# Patient Record
Sex: Female | Born: 1960 | Race: White | Hispanic: No | Smoking: Current every day smoker
Health system: Southern US, Community
[De-identification: ages and names within clinical notes are randomized; demographics above are authoritative.]

## PROBLEM LIST (undated history)

## (undated) DIAGNOSIS — C801 Malignant (primary) neoplasm, unspecified: Secondary | ICD-10-CM

## (undated) DIAGNOSIS — C539 Malignant neoplasm of cervix uteri, unspecified: Secondary | ICD-10-CM

## (undated) DIAGNOSIS — I219 Acute myocardial infarction, unspecified: Secondary | ICD-10-CM

## (undated) HISTORY — PX: TONSILLECTOMY: SUR1361

## (undated) HISTORY — PX: ABDOMINAL HYSTERECTOMY: SHX81

---

## 2014-10-22 ENCOUNTER — Encounter (HOSPITAL_COMMUNITY): Payer: Self-pay | Admitting: Emergency Medicine

## 2014-10-22 ENCOUNTER — Emergency Department (HOSPITAL_COMMUNITY)
Admission: EM | Admit: 2014-10-22 | Discharge: 2014-10-22 | Disposition: A | Discharge status: Home / Self Care | Payer: BLUE CROSS/BLUE SHIELD | Attending: Emergency Medicine | Admitting: Emergency Medicine

## 2014-10-22 ENCOUNTER — Emergency Department (HOSPITAL_COMMUNITY): Payer: BLUE CROSS/BLUE SHIELD

## 2014-10-22 DIAGNOSIS — M79672 Pain in left foot: Secondary | ICD-10-CM

## 2014-10-22 DIAGNOSIS — Z8541 Personal history of malignant neoplasm of cervix uteri: Secondary | ICD-10-CM | POA: Diagnosis not present

## 2014-10-22 DIAGNOSIS — Z8679 Personal history of other diseases of the circulatory system: Secondary | ICD-10-CM | POA: Insufficient documentation

## 2014-10-22 DIAGNOSIS — Z720 Tobacco use: Secondary | ICD-10-CM | POA: Insufficient documentation

## 2014-10-22 DIAGNOSIS — R2242 Localized swelling, mass and lump, left lower limb: Secondary | ICD-10-CM | POA: Diagnosis present

## 2014-10-22 HISTORY — DX: Acute myocardial infarction, unspecified: I21.9

## 2014-10-22 HISTORY — DX: Malignant neoplasm of cervix uteri, unspecified: C53.9

## 2014-10-22 HISTORY — DX: Malignant (primary) neoplasm, unspecified: C80.1

## 2014-10-22 MED ORDER — IBUPROFEN 800 MG PO TABS: 800.0000 mg | ORAL_TABLET | Freq: Three times a day (TID) | ORAL | Status: AC

## 2014-10-22 NOTE — ED Notes (Signed)
Pt from home c/o left foot swelling. She reports wearing shoes that pushed her toes together. She has been using advil that has helped some. Last dose of advil was 1700. Denies injury.

## 2014-10-22 NOTE — Discharge Instructions (Signed)
Cryotherapy °Cryotherapy means treatment with cold. Ice or gel packs can be used to reduce both pain and swelling. Ice is the most helpful within the first 24 to 48 hours after an injury or flare-up from overusing a muscle or joint. Sprains, strains, spasms, burning pain, shooting pain, and aches can all be eased with ice. Ice can also be used when recovering from surgery. Ice is effective, has very few side effects, and is safe for most people to use. °PRECAUTIONS  °Ice is not a safe treatment option for people with: °· Raynaud phenomenon. This is a condition affecting small blood vessels in the extremities. Exposure to cold may cause your problems to return. °· Cold hypersensitivity. There are many forms of cold hypersensitivity, including: °¨ Cold urticaria. Red, itchy hives appear on the skin when the tissues begin to warm after being iced. °¨ Cold erythema. This is a red, itchy rash caused by exposure to cold. °¨ Cold hemoglobinuria. Red blood cells break down when the tissues begin to warm after being iced. The hemoglobin that carry oxygen are passed into the urine because they cannot combine with blood proteins fast enough. °· Numbness or altered sensitivity in the area being iced. °If you have any of the following conditions, do not use ice until you have discussed cryotherapy with your caregiver: °· Heart conditions, such as arrhythmia, angina, or chronic heart disease. °· High blood pressure. °· Healing wounds or open skin in the area being iced. °· Current infections. °· Rheumatoid arthritis. °· Poor circulation. °· Diabetes. °Ice slows the blood flow in the region it is applied. This is beneficial when trying to stop inflamed tissues from spreading irritating chemicals to surrounding tissues. However, if you expose your skin to cold temperatures for too long or without the proper protection, you can damage your skin or nerves. Watch for signs of skin damage due to cold. °HOME CARE INSTRUCTIONS °Follow  these tips to use ice and cold packs safely. °· Place a dry or damp towel between the ice and skin. A damp towel will cool the skin more quickly, so you may need to shorten the time that the ice is used. °· For a more rapid response, add gentle compression to the ice. °· Ice for no more than 10 to 20 minutes at a time. The bonier the area you are icing, the less time it will take to get the benefits of ice. °· Check your skin after 5 minutes to make sure there are no signs of a poor response to cold or skin damage. °· Rest 20 minutes or more between uses. °· Once your skin is numb, you can end your treatment. You can test numbness by very lightly touching your skin. The touch should be so light that you do not see the skin dimple from the pressure of your fingertip. When using ice, most people will feel these normal sensations in this order: cold, burning, aching, and numbness. °· Do not use ice on someone who cannot communicate their responses to pain, such as small children or people with dementia. °HOW TO MAKE AN ICE PACK °Ice packs are the most common way to use ice therapy. Other methods include ice massage, ice baths, and cryosprays. Muscle creams that cause a cold, tingly feeling do not offer the same benefits that ice offers and should not be used as a substitute unless recommended by your caregiver. °To make an ice pack, do one of the following: °· Place crushed ice or a   bag of frozen vegetables in a sealable plastic bag. Squeeze out the excess air. Place this bag inside another plastic bag. Slide the bag into a pillowcase or place a damp towel between your skin and the bag. °· Mix 3 parts water with 1 part rubbing alcohol. Freeze the mixture in a sealable plastic bag. When you remove the mixture from the freezer, it will be slushy. Squeeze out the excess air. Place this bag inside another plastic bag. Slide the bag into a pillowcase or place a damp towel between your skin and the bag. °SEEK MEDICAL CARE  IF: °· You develop white spots on your skin. This may give the skin a blotchy (mottled) appearance. °· Your skin turns blue or pale. °· Your skin becomes waxy or hard. °· Your swelling gets worse. °MAKE SURE YOU:  °· Understand these instructions. °· Will watch your condition. °· Will get help right away if you are not doing well or get worse. °Document Released: 10/01/2010 Document Revised: 06/21/2013 Document Reviewed: 10/01/2010 °[employer]® Patient Information ©2015 [employer]. This information is not intended to replace advice given to you by your health care provider. Make sure you discuss any questions you have with your health care provider. ° °

## 2014-10-22 NOTE — ED Provider Notes (Signed)
CSN: [id number]     Arrival date & time 10/22/14  1908 History  This chart was scribed for Charlann Lange, PA-C, working with Evelina Bucy, MD by Julien Nordmann, ED Scribe. This patient was seen in room WTR8/WTR8 and the patient's care was started at 8:46 PM.    Chief Complaint  Patient presents with  . Foot Swelling      The history is provided by the patient. No language interpreter was used.   HPI Comments: Caroline Nunez is a 54 y.o. female who presents to the Emergency Department complaining of constant, gradual worsening left foot swelling onset 2 days ago. Pt reports wearing some tennis shoes that pushed her toes together. Pt reports breaking her left pinky toe and states it has given her trouble but her foot has not swollen like this before. Pt reports taking OTC Advil to alleviate the pain with minimal relief. Pt has also elevated her foot to help alleviate the swelling with relief. She states that she stands at work and she has worked long hour shifts this past week. Pt denies any previous injury.  Past Medical History  Diagnosis Date  . Cancer   . Cervical cancer   . Heart attack    Past Surgical History  Procedure Laterality Date  . Abdominal hysterectomy    . Tonsillectomy     No family history on file. Social History  Substance Use Topics  . Smoking status: Current Every Day Smoker -- 1.00 packs/day    Types: Cigarettes  . Smokeless tobacco: None  . Alcohol Use: No   OB History    No data available     Review of Systems  Constitutional: Negative for fever.  Musculoskeletal: Positive for joint swelling.  Skin: Negative for color change and wound.  Neurological: Negative for numbness.      Allergies  Codeine  Home Medications   Prior to Admission medications   Not on File   Triage vitals: BP 160/66 mmHg  Pulse 90  Temp(Src) 98.8 F (37.1 C) (Oral)  Resp 20  SpO2 99% Physical Exam  Constitutional: She appears well-developed and well-nourished. No  distress.  HENT:  Head: Normocephalic and atraumatic.  Eyes: Right eye exhibits no discharge. Left eye exhibits no discharge.  Pulmonary/Chest: Effort normal. No respiratory distress.  Musculoskeletal:  Left foot is moderately swollen extending to ankle where ankle has no swelling, no wound, no inner digital rash or weeping, no warmth or redness, the only tenderness is manipulation of the fifth digit, no calf tenderness  Neurological: She is alert.  Skin: She is not diaphoretic.  Psychiatric: She has a normal mood and affect. Her behavior is normal.  Nursing note and vitals reviewed.   ED Course  Procedures  DIAGNOSTIC STUDIES: Oxygen Saturation is 99% on RA, normal by my interpretation.  COORDINATION OF CARE:  8:50 PM Discussed treatment plan which includes work note for Monday, keep foot elevated with pt at bedside and pt agreed to plan.  Labs Review Labs Reviewed - No data to display  Imaging Review No results found. I have personally reviewed and evaluated these images and lab results as part of my medical decision-making.   EKG Interpretation None      MDM   Final diagnoses:  None    1. Left foot pain  No fracture injury. No evidence cellulitis, wound or infection. Swelling improves with elevation. Will recommend 800 mg ibuprofen q 8 hours and elevation with rest.   I personally performed the services  described in this documentation, which was scribed in my presence. The recorded information has been reviewed and is accurate. ,   Charlann Lange, PA-C 10/23/14 9675  Evelina Bucy, MD 10/23/14 [phone]

## 2016-06-21 IMAGING — CR DG FOOT COMPLETE 3+V*L*
3 series · 3 of 3 positions shown · non-contrast
Comparison: None.

CLINICAL DATA: Acute onset of left lateral and plantar foot pain,
and soft tissue swelling. Initial encounter.

EXAM:
LEFT FOOT - COMPLETE 3+ VIEW

[x foot ap left]
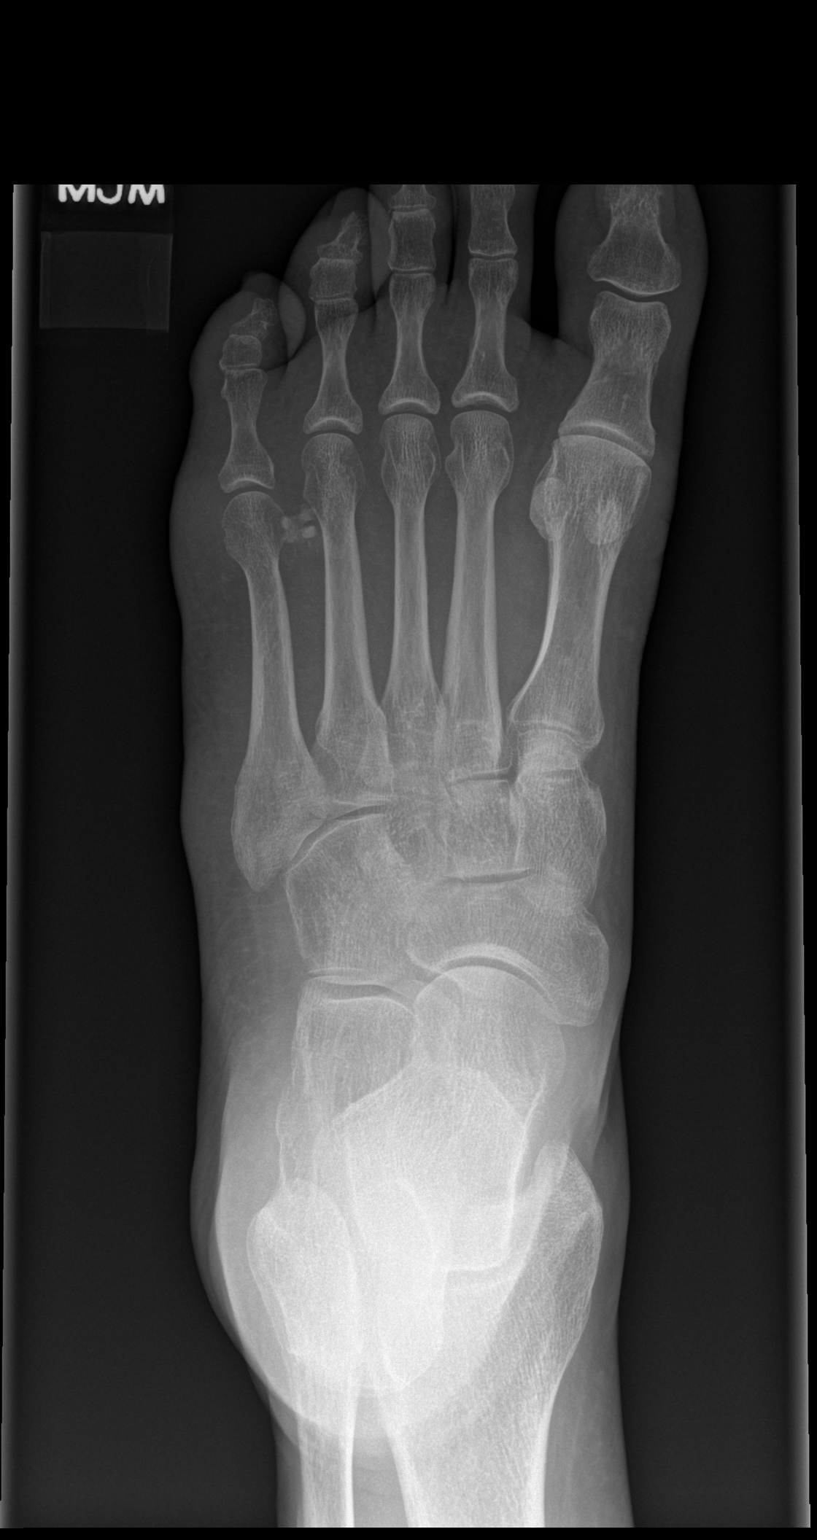

[x foot obl left]
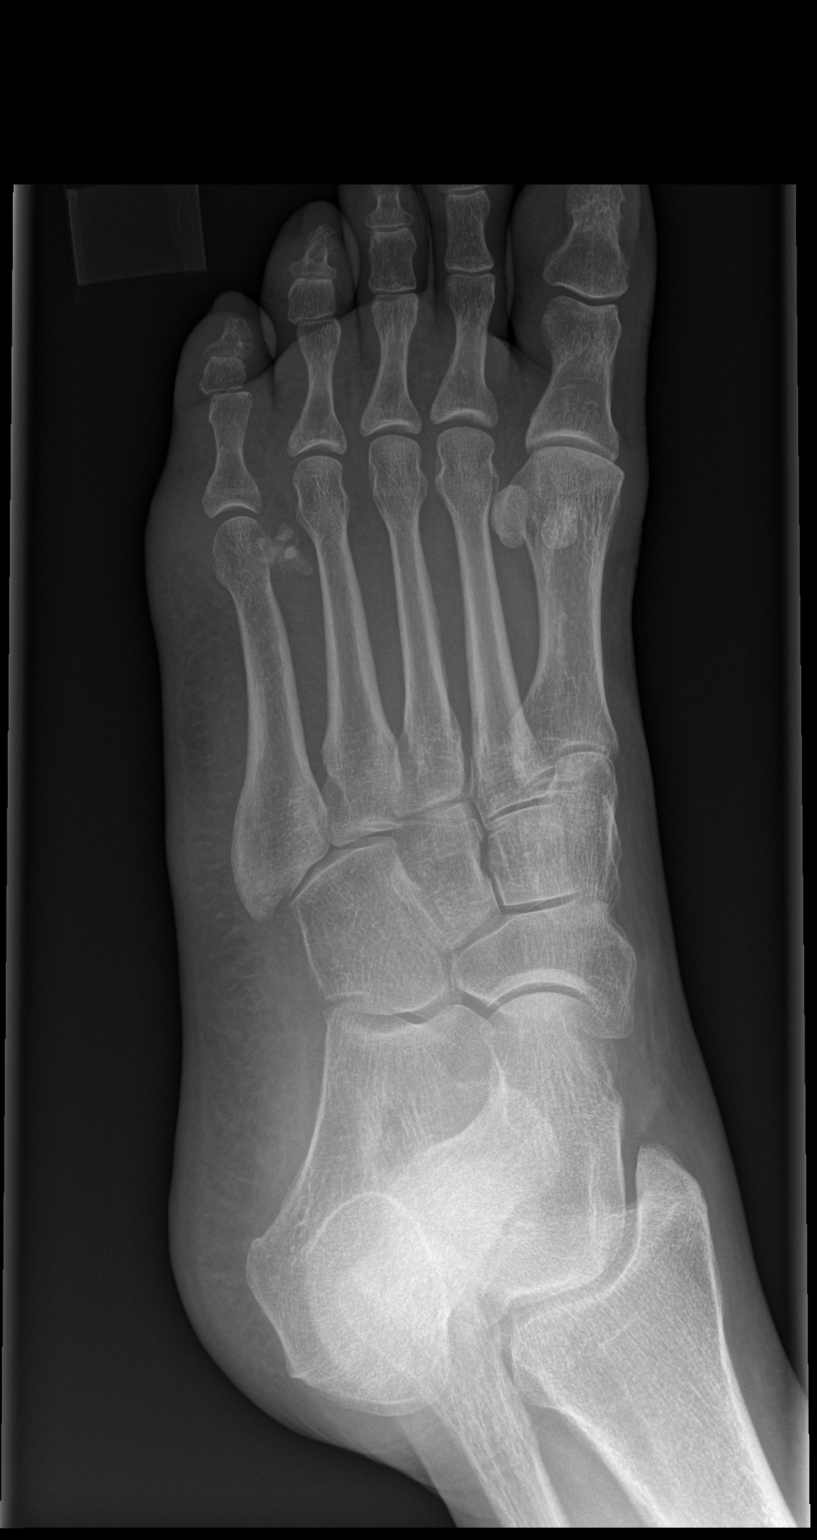

[x foot lat left]
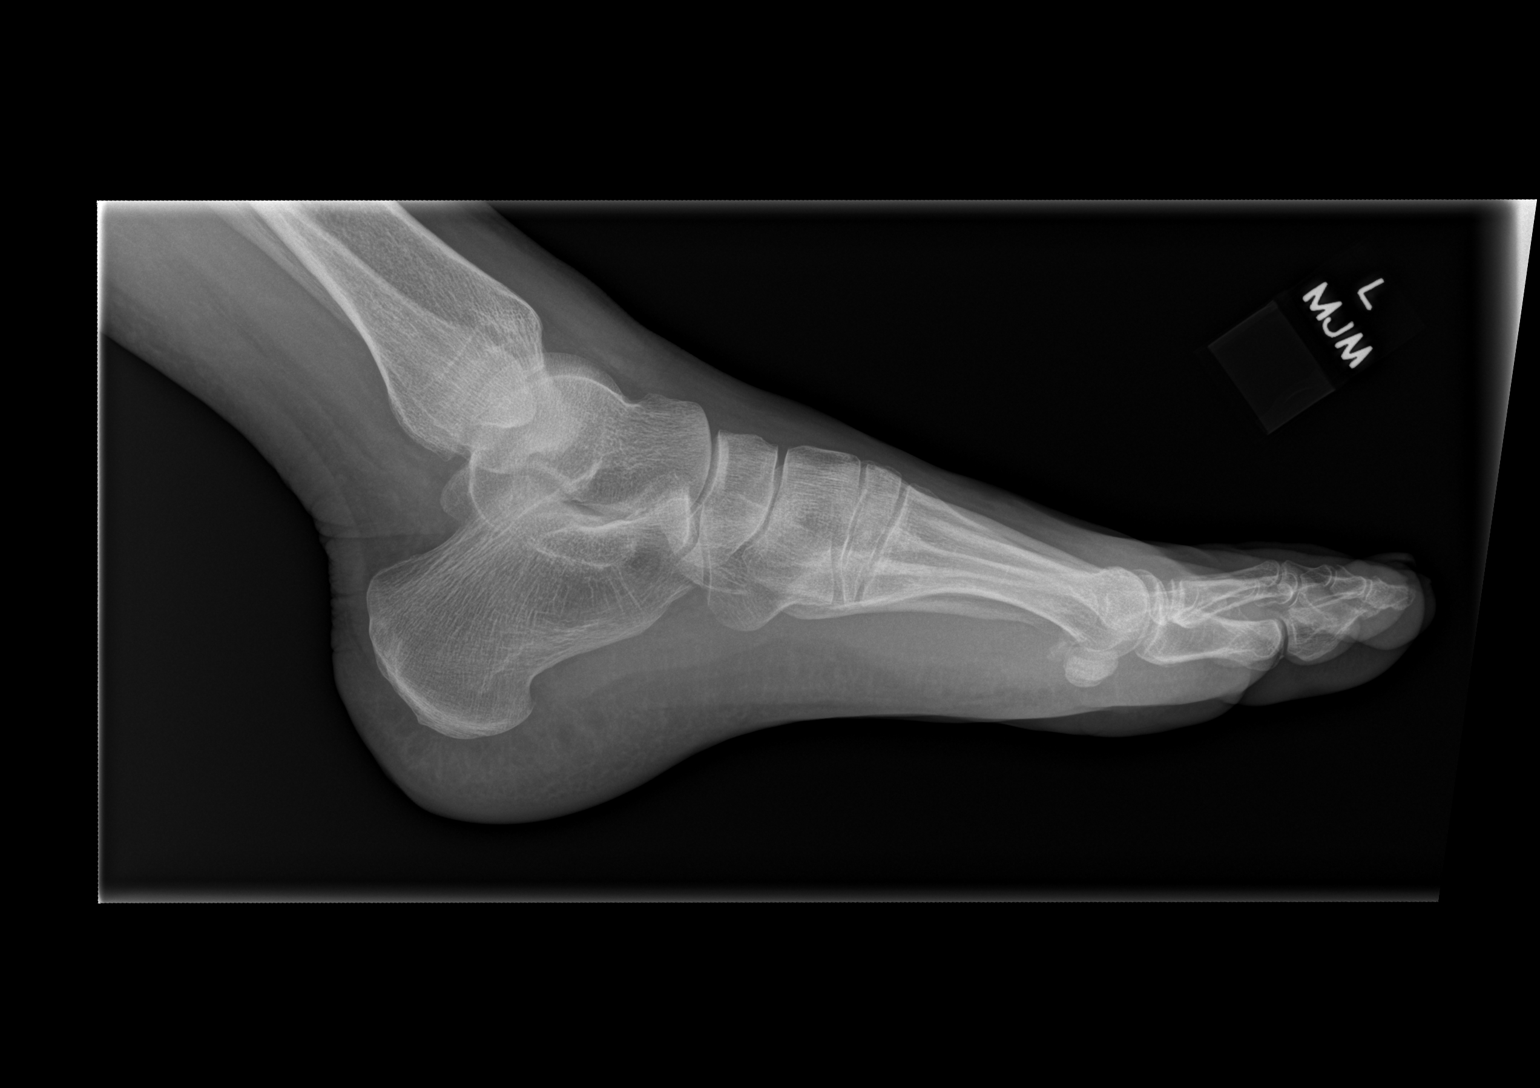

[3 of 3 positions shown; findings below may reference images not displayed]

FINDINGS: There is no evidence of fracture or dislocation. A few vague
high-density foci are seen projecting between the distal fourth and
fifth metatarsals, just plantar to the metatarsals. These may simply
reflect soft tissue calcifications, though would correlate for any
evidence of a soft tissue wound to suggest foreign bodies.

The joint spaces are preserved. There is no evidence of talar
subluxation; the subtalar joint is unremarkable in appearance.

No significant soft tissue abnormalities are seen.
IMPRESSION: 1. No evidence of fracture or dislocation.
2. Few vague high-density foci seen projecting between the distal
fourth and fifth metatarsals, just plantar to the metatarsals. These
may simply reflect soft tissue calcifications, though would
correlate for any evidence of a soft tissue wound to suggest foreign
bodies.

## 2020-07-07 ENCOUNTER — Ambulatory Visit: Payer: BLUE CROSS/BLUE SHIELD | Admitting: Family Medicine

## 2022-11-23 ENCOUNTER — Emergency Department (HOSPITAL_COMMUNITY)
Admission: EM | Admit: 2022-11-23 | Discharge: 2022-11-23 | Disposition: A | Discharge status: Home / Self Care | Payer: 59 | Attending: Emergency Medicine | Admitting: Emergency Medicine

## 2022-11-23 ENCOUNTER — Other Ambulatory Visit: Payer: Self-pay

## 2022-11-23 DIAGNOSIS — M62838 Other muscle spasm: Secondary | ICD-10-CM | POA: Insufficient documentation

## 2022-11-23 DIAGNOSIS — M542 Cervicalgia: Secondary | ICD-10-CM | POA: Diagnosis present

## 2022-11-23 MED ORDER — ACETAMINOPHEN 500 MG PO TABS
1000.0000 mg | ORAL_TABLET | Freq: Once | ORAL | Status: DC
  Filled 2022-11-23: qty 2

## 2022-11-23 MED ORDER — LIDOCAINE 5 % EX PTCH
1.0000 | MEDICATED_PATCH | Freq: Once | CUTANEOUS | Status: DC
  Administered 2022-11-23: 1 via TRANSDERMAL
  Filled 2022-11-23: qty 1

## 2022-11-23 NOTE — ED Triage Notes (Signed)
Patient reports posterior neck pain onset yesterday morning ,denies injury , took Advil prior to arrival with mild relief .

## 2022-11-23 NOTE — Discharge Instructions (Signed)
You were seen in the emergency department for your neck pain.  This did appear most consistent with a muscle spasm.  You can continue to take Tylenol or Advil as needed for pain.  Both can be taken up to every 6 hours.  You can use ice or heat or lidocaine patches.  You should try to stretch your neck and range her muscles to help prevent them from getting more stiff.  You can follow-up with your primary doctor in the next few days to have your symptoms rechecked.  You should return to the emergency department if you develop numbness or weakness in your arms or legs or any other new or concerning symptoms.

## 2022-11-23 NOTE — ED Provider Notes (Signed)
[place] EMERGENCY DEPARTMENT AT [facility] Provider Note   CSN: [id number] Arrival date & time: 11/23/22  8295     History  Chief Complaint  Patient presents with   Neck Pain     Caroline Nunez is a 62 y.o. female.  Patient is a 62 year old female with no significant past medical history presenting to the emergency department with neck pain.  The patient states that she woke up yesterday morning with a stiff neck.  She states that she initially took Motrin with improvement of her pain.  She states that she put on a heat patch last night before bed and then woke up around 3 AM to go to the bathroom and her pain worsened.  She states that it does feel like a muscle spasm type of pain.  She denies any numbness or weakness.  She states that she is again took Motrin this morning with some relief.  The history is provided by the patient and a relative.       Home Medications Prior to Admission medications   Medication Sig Start Date End Date Taking? Authorizing Provider  ibuprofen (ADVIL,MOTRIN) 800 MG tablet Take 1 tablet (800 mg total) by mouth 3 (three) times daily. 10/22/14   Elpidio Anis, PA-C      Allergies    Codeine    Review of Systems   Review of Systems  Physical Exam Updated Vital Signs BP (!) 145/84 (BP Location: Right Arm)   Pulse 85   Temp 97.7 F (36.5 C) (Oral)   Resp (!) 22   SpO2 100%  Physical Exam Vitals and nursing note reviewed.  Constitutional:      Appearance: Normal appearance.  HENT:     Head: Normocephalic and atraumatic.     Nose: Nose normal.     Mouth/Throat:     Mouth: Mucous membranes are moist.  Eyes:     Extraocular Movements: Extraocular movements intact.  Neck:     Comments: No midline neck tenderness Left-sided occipital tenderness and left trapezius muscle tenderness to palpation Neck rotation bilaterally intact, limited neck extension secondary to pain, neck flexion intact Cardiovascular:     Rate and Rhythm:  Normal rate.     Pulses: Normal pulses.  Pulmonary:     Effort: Pulmonary effort is normal.  Musculoskeletal:        General: Normal range of motion.  Skin:    General: Skin is warm and dry.  Neurological:     General: No focal deficit present.     Mental Status: She is alert and oriented to person, place, and time.     Sensory: No sensory deficit.     Motor: No weakness (5 out of 5 strength in bilateral grip strength, elbow flexion/extension and shoulder abduction).  Psychiatric:        Mood and Affect: Mood normal.        Behavior: Behavior normal.     ED Results / Procedures / Treatments   Labs (all labs ordered are listed, but only abnormal results are displayed) Labs Reviewed - No data to display  EKG None  Radiology No results found.  Procedures Procedures    Medications Ordered in ED Medications  acetaminophen (TYLENOL) tablet 1,000 mg (has no administration in time range)  lidocaine (LIDODERM) 5 % 1-3 patch (has no administration in time range)    ED Course/ Medical Decision Making/ A&P  Medical Decision Making This patient presents to the ED with chief complaint(s) of neck pain with no pertinent past medical history which further complicates the presenting complaint. The complaint involves an extensive differential diagnosis and also carries with it a high risk of complications and morbidity.    The differential diagnosis includes muscle strain or spasm, no midline neck tenderness and no trauma or falls making fracture dislocation unlikely, no neurologic deficits making a spinal cord injury or neurovascular injury unlikely, no fever or headache making meningitis unlikely  Additional history obtained: Additional history obtained from family Records reviewed Care Everywhere/External Records -outpatient orthopedic records  ED Course and Reassessment: On patient's arrival she is hemodynamically stable in no acute distress.  She  has no midline neck tenderness and no trauma, she is neurovascularly intact.  Exam is most consistent with muscle strain or spasm.  Patient declined any muscle relaxers or sedating medication and she will be given Tylenol and lidocaine patch.  She was recommended stretches and close primary care follow-up and was given strict return precautions.  Independent labs interpretation:  N/A  Independent visualization of imaging: - N/A  Consultation: - Consulted or discussed management/test interpretation w/ external professional: N/A  Consideration for admission or further workup: Patient has no emergent conditions requiring admission or further work-up at this time and is stable for discharge home with primary care follow-up  Social Determinants of health: N/A    Risk OTC drugs. Prescription drug management.          Final Clinical Impression(s) / ED Diagnoses Final diagnoses:  Cervical paraspinal muscle spasm    Rx / DC Orders ED Discharge Orders     None         Rexford Maus, DO 11/23/22 [phone]

## 2023-12-03 ENCOUNTER — Ambulatory Visit (INDEPENDENT_AMBULATORY_CARE_PROVIDER_SITE_OTHER)

## 2023-12-03 ENCOUNTER — Ambulatory Visit (HOSPITAL_COMMUNITY)
Admission: EM | Admit: 2023-12-03 | Discharge: 2023-12-03 | Disposition: A | Discharge status: Home / Self Care | Attending: Emergency Medicine | Admitting: Emergency Medicine

## 2023-12-03 ENCOUNTER — Encounter (HOSPITAL_COMMUNITY): Payer: Self-pay | Admitting: Emergency Medicine

## 2023-12-03 DIAGNOSIS — R0789 Other chest pain: Secondary | ICD-10-CM

## 2023-12-03 DIAGNOSIS — R0602 Shortness of breath: Secondary | ICD-10-CM

## 2023-12-03 DIAGNOSIS — F17209 Nicotine dependence, unspecified, with unspecified nicotine-induced disorders: Secondary | ICD-10-CM | POA: Diagnosis not present

## 2023-12-03 MED ORDER — ALBUTEROL SULFATE (2.5 MG/3ML) 0.083% IN NEBU
2.5000 mg | INHALATION_SOLUTION | Freq: Once | RESPIRATORY_TRACT | Status: AC
  Administered 2023-12-03: 2.5 mg via RESPIRATORY_TRACT

## 2023-12-03 MED ORDER — ALBUTEROL SULFATE HFA 108 (90 BASE) MCG/ACT IN AERS: 1.0000 | INHALATION_SPRAY | Freq: Four times a day (QID) | RESPIRATORY_TRACT | 0 refills | Status: AC | PRN

## 2023-12-03 MED ORDER — ALBUTEROL SULFATE (2.5 MG/3ML) 0.083% IN NEBU
INHALATION_SOLUTION | RESPIRATORY_TRACT | Status: AC
  Filled 2023-12-03: qty 3

## 2023-12-03 MED ORDER — IPRATROPIUM BROMIDE 0.02 % IN SOLN
RESPIRATORY_TRACT | Status: AC
  Filled 2023-12-03: qty 2.5

## 2023-12-03 MED ORDER — DEXAMETHASONE SOD PHOSPHATE PF 10 MG/ML IJ SOLN
10.0000 mg | Freq: Once | INTRAMUSCULAR | Status: AC
  Administered 2023-12-03: 10 mg via INTRAMUSCULAR

## 2023-12-03 MED ORDER — PREDNISONE 20 MG PO TABS: 40.0000 mg | ORAL_TABLET | Freq: Every day | ORAL | 0 refills | Status: AC

## 2023-12-03 MED ORDER — IPRATROPIUM BROMIDE 0.02 % IN SOLN
0.5000 mg | Freq: Once | RESPIRATORY_TRACT | Status: AC
  Administered 2023-12-03: 0.5 mg via RESPIRATORY_TRACT

## 2023-12-03 MED ORDER — AMOXICILLIN-POT CLAVULANATE 875-125 MG PO TABS: 1.0000 | ORAL_TABLET | Freq: Two times a day (BID) | ORAL | 0 refills | Status: AC

## 2023-12-03 NOTE — ED Triage Notes (Signed)
 Pt reports having chest tightness and hard to breath this morning. Had cold with lots of clear slime over the weekend. Taking Tylenol  cold and flu.

## 2023-12-03 NOTE — Discharge Instructions (Addendum)
 Chest x-ray did not reveal any pneumonia.  I believe you are having a COPD exacerbation from your chronic smoking.  We are treating this with antibiotics, take these twice daily with food for the next 7 days.  You are also being treated with steroids, take these daily with breakfast for the next 4 days.  They will help open your lungs up.  Please try to cut back or stop smoking.  It is important that you follow-up with a primary care provider for routine evaluation and ongoing monitoring.  They will be able to assist with an official diagnosis of COPD and medication management.  Seek immediate care if you develop any new concerning symptoms or no improvement over the next 2 to 3 days despite interventions.

## 2023-12-03 NOTE — ED Provider Notes (Addendum)
 [facility]    CSN: [id number] Arrival date & time: 12/03/23  0935      History   Chief Complaint Chief Complaint  Patient presents with   Shortness of Breath    HPI Caroline Nunez is a 63 y.o. female.   Patient brought into clinic by his son over concern of shortness of breath that started this morning.  She had a productive cough for the past 5 days or so and has been taking Tylenol  Cold and flu.  Thought she was feeling better this morning to go into work when she had to call her son, was breathless.  Gets short of breath with simple exertion such as walking to the car or putting on her clothes.  Son thinks maybe she had a fever at the beginning.  Has not complained of hot or cold chills for the past few days.  Did take 2 COVID test at home and they were negative.  Productive cough with clear sputum.  Is a chronic cigarette smoker, smokes about a pack per day for maybe the past 30 or 40 years, is unsure.  Is not having chest pain.  Reports a heaviness on her chest.  Shortness of breath with talking or any exertion.  Denies wheezing.  Does not seek routine medical care.  Has had difficulty finding a primary care provider with appointments or that accepts her insurance.  The history is provided by the patient and medical records.  Shortness of Breath   Past Medical History:  Diagnosis Date   Cancer (HCC)    Cervical cancer (HCC)    Heart attack (HCC)     There are no active problems to display for this patient.   Past Surgical History:  Procedure Laterality Date   ABDOMINAL HYSTERECTOMY     TONSILLECTOMY      OB History   No obstetric history on file.      Home Medications    Prior to Admission medications   Medication Sig Start Date End Date Taking? Authorizing Provider  albuterol (VENTOLIN HFA) 108 (90 Base) MCG/ACT inhaler Inhale 1-2 puffs into the lungs every 6 (six) hours as needed for wheezing or shortness of breath. 12/03/23  Yes Avelino Herren,  Mardel Grudzien  N, [name]  amoxicillin-clavulanate (AUGMENTIN) 875-125 MG tablet Take 1 tablet by mouth every 12 (twelve) hours. 12/03/23  Yes Yomira Flitton  N, [name]  predniSONE (DELTASONE) 20 MG tablet Take 2 tablets (40 mg total) by mouth daily with breakfast for 4 days. 12/03/23 12/07/23 Yes Jillana Selph  N, [name]  ibuprofen  (ADVIL ,MOTRIN ) 800 MG tablet Take 1 tablet (800 mg total) by mouth 3 (three) times daily. 10/22/14   Odell Balls, PA-C    Family History No family history on file.  Social History Social History   Tobacco Use   Smoking status: Every Day    Current packs/day: 1.00    Types: Cigarettes  Substance Use Topics   Alcohol use: No     Allergies   Codeine   Review of Systems Review of Systems  Per HPI  Physical Exam Triage Vital Signs ED Triage Vitals  Encounter Vitals Group     BP 12/03/23 0954 (!) 172/63     Girls Systolic BP Percentile --      Girls Diastolic BP Percentile --      Boys Systolic BP Percentile --      Boys Diastolic BP Percentile --      Pulse Rate 12/03/23 0954 77     Resp 12/03/23 0954 ROLLEN)  22     Temp 12/03/23 0954 97.7 F (36.5 C)     Temp Source 12/03/23 0954 Oral     SpO2 12/03/23 0954 93 %     Weight --      Height --      Head Circumference --      Peak Flow --      Pain Score 12/03/23 0955 0     Pain Loc --      Pain Education --      Exclude from Growth Chart --    No data found.  Updated Vital Signs BP (!) 172/63 (BP Location: Right Arm)   Pulse 77   Temp 97.7 F (36.5 C) (Oral)   Resp (!) 22   SpO2 93%   Visual Acuity Right Eye Distance:   Left Eye Distance:   Bilateral Distance:    Right Eye Near:   Left Eye Near:    Bilateral Near:     Physical Exam Vitals and nursing note reviewed.  Constitutional:      Appearance: Normal appearance. She is well-developed.  HENT:     Head: Normocephalic and atraumatic.     Right Ear: External ear normal.     Left Ear: External ear normal.     Nose: Nose normal.      Mouth/Throat:     Mouth: Mucous membranes are moist.  Eyes:     Conjunctiva/sclera: Conjunctivae normal.  Cardiovascular:     Rate and Rhythm: Normal rate and regular rhythm.  Pulmonary:     Breath sounds: Examination of the right-lower field reveals decreased breath sounds. Examination of the left-lower field reveals decreased breath sounds. Decreased breath sounds present. No wheezing.  Skin:    General: Skin is warm and dry.  Neurological:     General: No focal deficit present.     Mental Status: She is alert and oriented to person, place, and time.  Psychiatric:        Mood and Affect: Mood normal.        Behavior: Behavior normal. Behavior is cooperative.      UC Treatments / Results  Labs (all labs ordered are listed, but only abnormal results are displayed) Labs Reviewed - No data to display  EKG   Radiology DG Chest 2 View Result Date: 12/03/2023 CLINICAL DATA:  Chest tightness and shortness of breath. EXAM: CHEST - 2 VIEW COMPARISON:  None Available. FINDINGS: The heart size and mediastinal contours are within normal limits. No acute infiltrate, pleural effusion or pneumothorax is identified. The visualized skeletal structures are unremarkable. IMPRESSION: No active cardiopulmonary disease. Electronically Signed   By: Suzen Dials M.D.   On: 12/03/2023 11:17    Procedures Procedures (including critical care time)  Medications Ordered in UC Medications  dexamethasone (DECADRON) injection 10 mg (has no administration in time range)  albuterol (PROVENTIL) (2.5 MG/3ML) 0.083% nebulizer solution 2.5 mg (2.5 mg Nebulization Given 12/03/23 1111)  ipratropium (ATROVENT) nebulizer solution 0.5 mg (0.5 mg Nebulization Given 12/03/23 1111)    Initial Impression / Assessment and Plan / UC Course  I have reviewed the triage vital signs and the nursing notes.  Pertinent labs & imaging results that were available during my care of the patient were reviewed by me and  considered in my medical decision making (see chart for details).  Vitals and triage reviewed, patient is tachypneic with hypertension.  Increased work of breathing.  Visibly short of breath.  Oxygen ranges from 93 to 94%  on room air.  Winded when speaking.  Lungs with diminished sounds in lower lobes.  Without wheezing or rhonchi.  Heart with regular rate and rhythm.  Chest x-ray by my interpretation does not show pneumonia, radiology overread shows no active cardiopulmonary disease.  EKG by my interpretation shows normal sinus rhythm, without ST elevation or ST depression.  Rate of 78 bpm.  Albuterol/ipratropium nebulizer treatment given in clinic, continues to have chest tightness.  Will give IM steroid prior to departure.  Appears stable for discharge.  Strict emergency precautions given if no improvement over the next 48 hours or if new symptoms develop.  Discussed importance of establishing with a PCP for any official chronic diagnosis.  Also for cancer screenings.  Patient verbalized understanding, no questions at this time.  Work note provided.    Final Clinical Impressions(s) / UC Diagnoses   Final diagnoses:  Shortness of breath  Chest heaviness  Tobacco use disorder, continuous     Discharge Instructions      Chest x-ray did not reveal any pneumonia.  I believe you are having a COPD exacerbation from your chronic smoking.  We are treating this with antibiotics, take these twice daily with food for the next 7 days.  You are also being treated with steroids, take these daily with breakfast for the next 4 days.  They will help open your lungs up.  Please try to cut back or stop smoking.  It is important that you follow-up with a primary care provider for routine evaluation and ongoing monitoring.  They will be able to assist with an official diagnosis of COPD and medication management.  Seek immediate care if you develop any new concerning symptoms or no improvement over the  next 2 to 3 days despite interventions.      ED Prescriptions     Medication Sig Dispense Auth. Provider   amoxicillin-clavulanate (AUGMENTIN) 875-125 MG tablet Take 1 tablet by mouth every 12 (twelve) hours. 14 tablet [name], Rielynn Trulson  N, [name]   predniSONE (DELTASONE) 20 MG tablet Take 2 tablets (40 mg total) by mouth daily with breakfast for 4 days. 8 tablet [name], Semira Stoltzfus  N, [name]   albuterol (VENTOLIN HFA) 108 (90 Base) MCG/ACT inhaler Inhale 1-2 puffs into the lungs every 6 (six) hours as needed for wheezing or shortness of breath. 18 g [name], Arora Coakley  N, [name]      PDMP not reviewed this encounter.   [name], Esparanza Krider  N, [name] 12/03/23 1135    [name], Necie Wilcoxson  N, [name] 12/03/23 1152

## 2023-12-04 ENCOUNTER — Ambulatory Visit: Payer: Self-pay

## 2023-12-04 NOTE — Telephone Encounter (Signed)
 Pt states that she is calling to schedule appt for f/u. Pt was seen in UC yesterday, unsure what she was dx with, but states she was given albuterol, amoxicillin and prednisone. Pt states that she feels the same as yesterday, NT confirmed that pt is not feeling worse then d/c yesterday at [facility]. Pt scheduled for new pt appt at [facility] Copied from CRM [id number]. Topic: 11/5. Advised to call back if s/s worsen of if she has further questions, was advised to use UC or ED. Pt also advised of mobile bus, and advised location changes daily, to call and get location if needed.   Clinical - Red Word Triage >> Dec 04, 2023  4:07 PM Mercer PEDLAR wrote: Red Word that prompted transfer to Nurse Triage: Difficulty breathing

## 2023-12-05 ENCOUNTER — Emergency Department (HOSPITAL_BASED_OUTPATIENT_CLINIC_OR_DEPARTMENT_OTHER): Admitting: Radiology

## 2023-12-05 ENCOUNTER — Emergency Department (HOSPITAL_BASED_OUTPATIENT_CLINIC_OR_DEPARTMENT_OTHER)
Admission: EM | Admit: 2023-12-05 | Discharge: 2023-12-05 | Disposition: A | Discharge status: Home / Self Care | Source: Ambulatory Visit | Attending: Emergency Medicine | Admitting: Emergency Medicine

## 2023-12-05 ENCOUNTER — Emergency Department (HOSPITAL_BASED_OUTPATIENT_CLINIC_OR_DEPARTMENT_OTHER)

## 2023-12-05 ENCOUNTER — Encounter (HOSPITAL_BASED_OUTPATIENT_CLINIC_OR_DEPARTMENT_OTHER): Payer: Self-pay

## 2023-12-05 DIAGNOSIS — F17210 Nicotine dependence, cigarettes, uncomplicated: Secondary | ICD-10-CM | POA: Diagnosis not present

## 2023-12-05 DIAGNOSIS — J441 Chronic obstructive pulmonary disease with (acute) exacerbation: Secondary | ICD-10-CM | POA: Diagnosis not present

## 2023-12-05 DIAGNOSIS — R0600 Dyspnea, unspecified: Secondary | ICD-10-CM | POA: Diagnosis present

## 2023-12-05 LAB — BASIC METABOLIC PANEL WITH GFR
Anion gap: 14 (ref 5–15)
BUN: 10 mg/dL (ref 8–23)
CO2: 28 mmol/L (ref 22–32)
Calcium: 10 mg/dL (ref 8.9–10.3)
Chloride: 100 mmol/L (ref 98–111)
Creatinine, Ser: 0.6 mg/dL (ref 0.44–1.00)
GFR, Estimated: >60 mL/min (ref >60)
Glucose, Bld: 197 mg/dL — ABNORMAL HIGH (ref 70–99)
Potassium: 4 mmol/L (ref 3.5–5.1)
Sodium: 141 mmol/L (ref 135–145)

## 2023-12-05 LAB — CBC
HCT: 47 % — ABNORMAL HIGH (ref 36.0–46.0)
Hemoglobin: 15.8 g/dL — ABNORMAL HIGH (ref 12.0–15.0)
MCH: 31.4 pg (ref 26.0–34.0)
MCHC: 33.6 g/dL (ref 30.0–36.0)
MCV: 93.4 fL (ref 80.0–100.0)
Platelets: 289 K/uL (ref 150–400)
RBC: 5.03 MIL/uL (ref 3.87–5.11)
RDW: 13.2 % (ref 11.5–15.5)
WBC: 9.8 K/uL (ref 4.0–10.5)
nRBC: 0 % (ref 0.0–0.2)

## 2023-12-05 LAB — RESP PANEL BY RT-PCR (RSV, FLU A&B, COVID)  RVPGX2
Influenza A by PCR: NEGATIVE
Influenza B by PCR: NEGATIVE
Resp Syncytial Virus by PCR: NEGATIVE
SARS Coronavirus 2 by RT PCR: NEGATIVE

## 2023-12-05 MED ORDER — METHYLPREDNISOLONE SODIUM SUCC 125 MG IJ SOLR
125.0000 mg | Freq: Once | INTRAMUSCULAR | Status: AC
  Administered 2023-12-05: 125 mg via INTRAVENOUS
  Filled 2023-12-05: qty 2

## 2023-12-05 MED ORDER — ALBUTEROL (5 MG/ML) CONTINUOUS INHALATION SOLN: 7.5000 mg/h | INHALATION_SOLUTION | Freq: Once | RESPIRATORY_TRACT | Status: DC

## 2023-12-05 MED ORDER — IPRATROPIUM-ALBUTEROL 0.5-2.5 (3) MG/3ML IN SOLN
3.0000 mL | Freq: Once | RESPIRATORY_TRACT | Status: AC
  Administered 2023-12-05: 3 mL via RESPIRATORY_TRACT

## 2023-12-05 MED ORDER — SPACER/AERO-HOLDING CHAMBERS DEVI: 0 refills | Status: AC

## 2023-12-05 MED ORDER — ALBUTEROL SULFATE (2.5 MG/3ML) 0.083% IN NEBU
7.5000 mg | INHALATION_SOLUTION | Freq: Once | RESPIRATORY_TRACT | Status: AC
  Administered 2023-12-05: 7.5 mg via RESPIRATORY_TRACT
  Filled 2023-12-05: qty 9

## 2023-12-05 MED ORDER — AZITHROMYCIN 250 MG PO TABS: 250.0000 mg | ORAL_TABLET | Freq: Every day | ORAL | 0 refills | Status: AC

## 2023-12-05 MED ORDER — ALBUTEROL SULFATE HFA 108 (90 BASE) MCG/ACT IN AERS: 1.0000 | INHALATION_SPRAY | Freq: Four times a day (QID) | RESPIRATORY_TRACT | 0 refills | Status: AC | PRN

## 2023-12-05 MED ADMIN — Ipratropium-Albuterol Nebu Soln 0.5-2.5(3) MG/3ML: 3 mL | RESPIRATORY_TRACT | NDC 76204060001

## 2023-12-05 MED FILL — Ipratropium-Albuterol Nebu Soln 0.5-2.5(3) MG/3ML: 3.0000 mL | RESPIRATORY_TRACT | Qty: 3 | Status: AC

## 2023-12-05 NOTE — ED Notes (Signed)
 Pt alert and oriented X 4 at the time of discharge. RR even and unlabored. No acute distress noted. Pt verbalized understanding of discharge instructions as discussed. Pt ambulatory to lobby at time of discharge.

## 2023-12-05 NOTE — ED Provider Notes (Signed)
 [name] Provider Note   CSN: [id number] Arrival date & time: 12/05/23  1135     Patient presents with: Shortness of Breath   Caroline Nunez is a 63 y.o. female who presents with dyspnea that has been progressively worsening over the last 3 days, no previously diagnosed history of pulmonary disease though endorses 1 to 1-1/2 pack/day smoking over the last 30 to 40 years.  Endorses subjective fever that began 3 days ago, has done COVID test at home x 2 with negative results.  Is concerned is because she has been at work, works in [employer] where majority of her coworkers have been sick with respiratory symptoms.  She endorses exertional dyspnea, often stating that it is difficult for her to complete simple tasks without acute exacerbation in her dyspnea.  Seen at urgent care on 03 December 2023 and managed for likely COPD exacerbation despite the fact that the patient does not have a previous diagnosis of this in her chart, findings on imaging and on medical history were consistent with a presumptive diagnosis of COPD with acute exacerbation.  She was prescribed a albuterol inhaler to be used as needed, started on a course of Augmentin, and also started on a burst dose of prednisone 40 mg over 4 days.  She states that while she was able to take her dose of prednisone this morning she was unable to take her antibiotic prior to presenting to the ED for further evaluation.  Had previously been on over-the-counter decongestants, was advised to discontinue this by primary care.  States that she felt that congestion has moved from her face and her head and into her chest as of late as she has had increased cough with mucus production of thick purulent sputum.    Shortness of Breath Associated symptoms: cough        Prior to Admission medications   Medication Sig Start Date End Date Taking? Authorizing Provider  albuterol (VENTOLIN HFA) 108 (90  Base) MCG/ACT inhaler Inhale 1-2 puffs into the lungs every 6 (six) hours as needed for wheezing or shortness of breath. 12/05/23  Yes Myriam Dorn BROCKS, PA  azithromycin (ZITHROMAX) 250 MG tablet Take 1 tablet (250 mg total) by mouth daily. Take first 2 tablets together, then 1 every day until finished. 12/05/23  Yes Myriam Dorn BROCKS, PA  predniSONE (STERAPRED UNI-PAK 21 TAB) 10 MG (21) TBPK tablet Take by mouth daily. Take 6 tabs by mouth daily  for 2 days, then 5 tabs for 2 days, then 4 tabs for 2 days, then 3 tabs for 2 days, 2 tabs for 2 days, then 1 tab by mouth daily for 2 days 12/05/23  Yes Myriam Dorn BROCKS, PA  Spacer/Aero-Holding Raguel FRENCH Used with albuterol inhaler. 12/05/23  Yes Myriam Dorn BROCKS, PA  albuterol (VENTOLIN HFA) 108 (90 Base) MCG/ACT inhaler Inhale 1-2 puffs into the lungs every 6 (six) hours as needed for wheezing or shortness of breath. 12/03/23   [name], [name], [name]  amoxicillin-clavulanate (AUGMENTIN) 875-125 MG tablet Take 1 tablet by mouth every 12 (twelve) hours. 12/03/23   [name], [name], [name]  ibuprofen  (ADVIL ,MOTRIN ) 800 MG tablet Take 1 tablet (800 mg total) by mouth 3 (three) times daily. 10/22/14   Odell Balls, PA-C  predniSONE (DELTASONE) 20 MG tablet Take 2 tablets (40 mg total) by mouth daily with breakfast for 4 days. 12/03/23 12/07/23  [name], [name], [name]    Allergies: Codeine  Review of Systems  HENT:  Positive for congestion.   Respiratory:  Positive for cough, chest tightness and shortness of breath.     Updated Vital Signs BP 133/63   Pulse 90   Temp 97.6 F (36.4 C) (Oral)   Resp 20   SpO2 92%   Physical Exam Vitals and nursing note reviewed.  Constitutional:      General: She is awake. She is not in acute distress.    Appearance: Normal appearance. She is well-developed and normal weight. She is not toxic-appearing.  HENT:     Head: Normocephalic and atraumatic.     Right Ear: Hearing, tympanic membrane,  ear canal and external ear normal.     Left Ear: Hearing, tympanic membrane, ear canal and external ear normal.     Nose: Mucosal edema and congestion present.     Right Turbinates: Enlarged.     Left Turbinates: Enlarged.     Mouth/Throat:     Mouth: Mucous membranes are moist.     Pharynx: Oropharynx is clear. Uvula midline. Posterior oropharyngeal erythema present.  Eyes:     Extraocular Movements: Extraocular movements intact.     Conjunctiva/sclera: Conjunctivae normal.     Pupils: Pupils are equal, round, and reactive to light.  Cardiovascular:     Rate and Rhythm: Normal rate and regular rhythm.     Pulses: Normal pulses.     Heart sounds: Normal heart sounds, S1 normal and S2 normal. No murmur heard.    No friction rub. No gallop.  Pulmonary:     Effort: Pulmonary effort is normal. Prolonged expiration present. No accessory muscle usage or respiratory distress.     Breath sounds: Decreased air movement present. Examination of the right-middle field reveals wheezing. Examination of the left-middle field reveals wheezing. Examination of the right-lower field reveals wheezing. Examination of the left-lower field reveals wheezing. Wheezing present.     Comments: Able to speak in nearly full sentences but is obviously short of breath with prolonged speaking. Abdominal:     General: Abdomen is flat. Bowel sounds are normal.     Palpations: Abdomen is soft.  Musculoskeletal:        General: Normal range of motion.     Cervical back: Normal range of motion and neck supple.     Right lower leg: No edema.     Left lower leg: No edema.  Skin:    General: Skin is warm and dry.     Capillary Refill: Capillary refill takes less than 2 seconds.  Neurological:     General: No focal deficit present.     Mental Status: She is alert. Mental status is at baseline.  Psychiatric:        Mood and Affect: Mood normal.        Behavior: Behavior is cooperative.     (all labs ordered are  listed, but only abnormal results are displayed) Labs Reviewed  BASIC METABOLIC PANEL WITH GFR - Abnormal; Notable for the following components:      Result Value   Glucose, Bld 197 (*)    All other components within normal limits  CBC - Abnormal; Notable for the following components:   Hemoglobin 15.8 (*)    HCT 47.0 (*)    All other components within normal limits  RESP PANEL BY RT-PCR (RSV, FLU A&B, COVID)  [id number]    EKG: EKG Interpretation Date/Time:  Friday December 05 2023 11:52:11 EDT Ventricular Rate:  81 PR Interval:  152  QRS Duration:  82 QT Interval:  390 QTC Calculation: 453 R Axis:   82  Text Interpretation: Normal sinus rhythm Cannot rule out Anterior infarct , age undetermined Abnormal ECG Confirmed by Patsey Lot [phone]) on 12/05/2023 11:56:44 AM  Radiology: [facility] Chest Port 1 View Result Date: 12/05/2023 EXAM: 1 VIEW XRAY OF THE CHEST 12/05/2023 12:51:20 PM COMPARISON: 12/03/2023. CLINICAL HISTORY: Shortness of breath. FINDINGS: The cardiomediastinal silhouette is unchanged. Mild right basilar scarring/atelectasis. No focal consolidation, sizable pleural effusion, or pneumothorax. No acute osseous abnormality. IMPRESSION: 1. Mild right basilar scarring/atelectasis. Otherwise, no acute cardiopulmonary findings. Electronically signed by: Shahmeer Marcelino MD 12/05/2023 02:12 PM EDT RP Workstation: [id number]     Procedures   Medications Ordered in the ED  ipratropium-albuterol (DUONEB) 0.5-2.5 (3) MG/3ML nebulizer solution 3 mL (3 mLs Nebulization Given 12/05/23 1206)  ipratropium-albuterol (DUONEB) 0.5-2.5 (3) MG/3ML nebulizer solution 3 mL (3 mLs Nebulization Given 12/05/23 1304)  methylPREDNISolone sodium succinate (SOLU-MEDROL) 125 mg/2 mL injection 125 mg (125 mg Intravenous Given 12/05/23 1328)  albuterol (PROVENTIL) (2.5 MG/3ML) 0.083% nebulizer solution 7.5 mg (7.5 mg Nebulization Given 12/05/23 1440)                                    Medical  Decision Making Amount and/or Complexity of Data Reviewed Labs: ordered. Radiology: ordered.  Risk Prescription drug management.   Medical Decision Making:   Cheyna Retana is a 63 y.o. female who presented to the ED today with dyspnea detailed above.    External chart has been reviewed including urgent care records as well as labs and imaging. Patient's presentation is complicated by their history of smoking history and likely undiagnosed COPD.SABRA  Patient placed on continuous vitals and telemetry monitoring while in ED which was reviewed periodically.  Complete initial physical exam performed, notably the patient  was alert and oriented in no apparent distress, getting complete sentences but obviously short of breath with prolonged speaking.  Decreased air entry into all lung fields with wheezing appreciated in the mid and lower fields bilaterally..    Reviewed and confirmed nursing documentation for past medical history, family history, social history.    Initial Assessment:   With the patient's presentation of dyspnea, insert or acute pneumonia, also consider exacerbation of undiagnosed COPD, consider atypical pneumonia.  Further consider upper respiratory infection, viral URI, COVID/flu.   Initial Plan:  Provided with DuoNeb in triage, provided with second dose of DuoNeb and reassess for potentially continuous albuterol nebulizer. Monitor respiratory status, she currently does not have any signs of respiratory distress or increased work of breathing, though consider BiPAP if this develops. Screening labs including CBC and Metabolic panel to evaluate for infectious or metabolic etiology of disease.  Obtain nasopharyngeal swab to evaluate for viral etiology CXR to evaluate for structural/infectious intrathoracic pathology.  EKG to evaluate for cardiac pathology Objective evaluation as below reviewed   Initial Study Results:   Laboratory  All laboratory results reviewed without evidence  of clinically relevant pathology.   Exceptions include: Hemoglobin is elevated to 15.8 as is the hematocrit 47.  EKG EKG was reviewed independently. Rate, rhythm, axis, intervals all examined and without medically relevant abnormality. ST segments without concerns for elevations.    Radiology:  All images reviewed independently. Agree with radiology report at this time.   DG Chest Port 1 View Result Date: 12/05/2023 EXAM: 1 VIEW XRAY OF THE CHEST 12/05/2023 12:51:20 PM COMPARISON:  12/03/2023. CLINICAL HISTORY: Shortness of breath. FINDINGS: The cardiomediastinal silhouette is unchanged. Mild right basilar scarring/atelectasis. No focal consolidation, sizable pleural effusion, or pneumothorax. No acute osseous abnormality. IMPRESSION: 1. Mild right basilar scarring/atelectasis. Otherwise, no acute cardiopulmonary findings. Electronically signed by: Harrietta Sherry MD 12/05/2023 02:12 PM EDT RP Workstation: [id number]   DG Chest 2 View Result Date: 12/03/2023 CLINICAL DATA:  Chest tightness and shortness of breath. EXAM: CHEST - 2 VIEW COMPARISON:  None Available. FINDINGS: The heart size and mediastinal contours are within normal limits. No acute infiltrate, pleural effusion or pneumothorax is identified. The visualized skeletal structures are unremarkable. IMPRESSION: No active cardiopulmonary disease. Electronically Signed   By: Suzen Dials M.D.   On: 12/03/2023 11:17      Reassessment and Plan:   Chest x-ray does show atelectasis and the right base, also does not show any acute cardiopulmonary findings.  Further, the hemoglobin is increased to 15.8 and hematocrit is 47 which is likely secondary to chronic hypoxia related to COPD.  X-ray also shows hyperinflation that is also likely consistent with COPD.  She was provided with multiple doses of DuoNeb as well as a continuous albuterol inhaler and was provided with a dose of Solu-Medrol intravenously.  After these were given to her she had  a significant improvement in air entry into all lung fields, and is now producing thick clear sputum with cough.  Respiratory panel swab is negative for all tested pathogens.  Given these findings, will begin treatment of the patient with a course of azithromycin for her COPD exacerbation.  Further will have her continue to take albuterol inhaler every 4 hours for the next 24 hours followed by every 6 hours for the subsequent 48 hours, to follow-up with pulmonology for assessment of her function.  Further have provided with referrals for primary care to follow her chronic disease as well as progression of the acute exacerbation.  This was thoroughly discussed with the patient along with careful return precautions.  She states she understands and agrees has no further concerns at this time.  She has questions about use of Mucinex for her cough, counseled patient that this would be acceptable as long as she had increased oral fluid intake.  As she has had significant improvement with therapies given, does not show any signs of hypoxia at rest, and is able to tolerate ambulation, find she is stable for discharge and outpatient management at this time.  Again will prescribe a course of azithromycin for COPD exacerbation as well as a prednisone taper to supplant her previously prescribed a burst dose of prednisone.  Further have her continue with albuterol as previously described.  Prescription sent in for same for ensuring patient has sufficient supply.  Will also provide with spacer chamber for efficacy of the albuterol inhaler.       Final diagnoses:  COPD with acute exacerbation [facility])    ED Discharge Orders          Ordered    albuterol (VENTOLIN HFA) 108 (90 Base) MCG/ACT inhaler  Every 6 hours PRN        12/05/23 1646    predniSONE (STERAPRED UNI-PAK 21 TAB) 10 MG (21) TBPK tablet  Daily        12/05/23 1646    Spacer/Aero-Holding Chambers DEVI        12/05/23 1646    azithromycin (ZITHROMAX)  250 MG tablet  Daily        12/05/23 1646  Myriam Dorn BROCKS, PA 12/05/23 1647    Patsey Lot, MD 12/06/23 [phone]

## 2023-12-05 NOTE — Progress Notes (Signed)
 RT gave pt 2nd Duoneb for SOB. No distress noted at this time.

## 2023-12-05 NOTE — ED Triage Notes (Signed)
 Pt c/o continued SHOB since being seen at [facility] yesterday. Compliant w prescription medications, no relief. Reports symptoms originally started Saturday  Reports dx of COPD, smoker Last albuterol inhaler this AM 0400

## 2023-12-05 NOTE — Discharge Instructions (Addendum)
 As we discussed, begin taking the prescribed antibiotic today and continue as prescribed, you can continue to take previously prescribed antibiotic as well.  Further, as we discussed use the albuterol inhaler every 4 hours for the next 24 hours followed by every 6 hours over the following 2 days.  Provided you with referrals for primary care and pulmonology, call to schedule appointments.

## 2023-12-05 NOTE — Progress Notes (Signed)
 Rt gave breathing tx (DUONEB) for SOB. No distress noted at this time.

## 2023-12-24 ENCOUNTER — Ambulatory Visit: Payer: Self-pay | Admitting: Family
